# Patient Record
Sex: Male | Born: 1980 | Race: White | Hispanic: No | Marital: Married | Smoking: Current every day smoker
Health system: Southern US, Community
[De-identification: ages and names within clinical notes are randomized; demographics above are authoritative.]

## PROBLEM LIST (undated history)

## (undated) DIAGNOSIS — K8020 Calculus of gallbladder without cholecystitis without obstruction: Secondary | ICD-10-CM

## (undated) HISTORY — PX: MINOR EXCISION EAR CANAL MASS: SHX6241

## (undated) HISTORY — PX: WISDOM TOOTH EXTRACTION: SHX21

---

## 2003-11-02 ENCOUNTER — Emergency Department (HOSPITAL_COMMUNITY): Admission: EM | Admit: 2003-11-02 | Discharge: 2003-11-02 | Payer: Self-pay | Admitting: *Deleted

## 2006-03-11 ENCOUNTER — Emergency Department (HOSPITAL_COMMUNITY): Admission: EM | Admit: 2006-03-11 | Discharge: 2006-03-11 | Payer: Self-pay | Admitting: Emergency Medicine

## 2008-05-13 ENCOUNTER — Ambulatory Visit (HOSPITAL_COMMUNITY): Admission: RE | Admit: 2008-05-13 | Discharge: 2008-05-13 | Payer: Self-pay | Admitting: Family Medicine

## 2009-05-20 ENCOUNTER — Emergency Department (HOSPITAL_COMMUNITY): Admission: EM | Admit: 2009-05-20 | Discharge: 2009-05-20 | Payer: Self-pay | Admitting: Emergency Medicine

## 2015-03-10 ENCOUNTER — Encounter (HOSPITAL_COMMUNITY): Payer: Self-pay | Admitting: Emergency Medicine

## 2015-03-10 ENCOUNTER — Observation Stay (HOSPITAL_COMMUNITY)
Admission: EM | Admit: 2015-03-10 | Discharge: 2015-03-12 | Disposition: A | Discharge status: Home / Self Care | Payer: BLUE CROSS/BLUE SHIELD | Attending: General Surgery | Admitting: General Surgery

## 2015-03-10 ENCOUNTER — Emergency Department (HOSPITAL_COMMUNITY): Payer: BLUE CROSS/BLUE SHIELD

## 2015-03-10 DIAGNOSIS — Z79899 Other long term (current) drug therapy: Secondary | ICD-10-CM | POA: Diagnosis not present

## 2015-03-10 DIAGNOSIS — K8000 Calculus of gallbladder with acute cholecystitis without obstruction: Principal | ICD-10-CM | POA: Insufficient documentation

## 2015-03-10 DIAGNOSIS — K8010 Calculus of gallbladder with chronic cholecystitis without obstruction: Secondary | ICD-10-CM | POA: Diagnosis present

## 2015-03-10 DIAGNOSIS — K810 Acute cholecystitis: Secondary | ICD-10-CM

## 2015-03-10 DIAGNOSIS — F17200 Nicotine dependence, unspecified, uncomplicated: Secondary | ICD-10-CM | POA: Insufficient documentation

## 2015-03-10 HISTORY — DX: Calculus of gallbladder without cholecystitis without obstruction: K80.20

## 2015-03-10 LAB — COMPREHENSIVE METABOLIC PANEL
ALK PHOS: 85 U/L (ref 38–126)
ALT: 42 U/L (ref 17–63)
ANION GAP: 8 (ref 5–15)
AST: 30 U/L (ref 15–41)
Albumin: 4.2 g/dL (ref 3.5–5.0)
BUN: 10 mg/dL (ref 6–20)
CO2: 27 mmol/L (ref 22–32)
Calcium: 9.3 mg/dL (ref 8.9–10.3)
Chloride: 103 mmol/L (ref 101–111)
Creatinine, Ser: 0.97 mg/dL (ref 0.61–1.24)
Glucose, Bld: 113 mg/dL — ABNORMAL HIGH (ref 65–99)
Potassium: 3.7 mmol/L (ref 3.5–5.1)
Sodium: 138 mmol/L (ref 135–145)
TOTAL PROTEIN: 7.6 g/dL (ref 6.5–8.1)
Total Bilirubin: 0.5 mg/dL (ref 0.3–1.2)

## 2015-03-10 LAB — CBC
HEMATOCRIT: 45.9 % (ref 39.0–52.0)
HEMOGLOBIN: 15.7 g/dL (ref 13.0–17.0)
MCH: 29.7 pg (ref 26.0–34.0)
MCHC: 34.2 g/dL (ref 30.0–36.0)
MCV: 86.8 fL (ref 78.0–100.0)
Platelets: 274 10*3/uL (ref 150–400)
RBC: 5.29 MIL/uL (ref 4.22–5.81)
RDW: 12.1 % (ref 11.5–15.5)
WBC: 9.8 10*3/uL (ref 4.0–10.5)

## 2015-03-10 LAB — URINALYSIS, ROUTINE W REFLEX MICROSCOPIC
BILIRUBIN URINE: NEGATIVE
Glucose, UA: NEGATIVE mg/dL
Hgb urine dipstick: NEGATIVE
Ketones, ur: NEGATIVE mg/dL
Leukocytes, UA: NEGATIVE
NITRITE: NEGATIVE
PH: 7.5 (ref 5.0–8.0)
SPECIFIC GRAVITY, URINE: 1.015 (ref 1.005–1.030)

## 2015-03-10 LAB — URINE MICROSCOPIC-ADD ON
Squamous Epithelial / LPF: NONE SEEN
WBC UA: NONE SEEN WBC/hpf (ref 0–5)

## 2015-03-10 LAB — LIPASE, BLOOD: LIPASE: 17 U/L (ref 11–51)

## 2015-03-10 MED ORDER — DIPHENHYDRAMINE HCL 50 MG/ML IJ SOLN: 25.0000 mg | Freq: Four times a day (QID) | INTRAMUSCULAR | Status: DC | PRN

## 2015-03-10 MED ORDER — HYDROMORPHONE HCL 1 MG/ML IJ SOLN
1.0000 mg | INTRAMUSCULAR | Status: DC | PRN
  Administered 2015-03-10 – 2015-03-12 (×4): 1 mg via INTRAVENOUS
  Filled 2015-03-10 (×4): qty 1

## 2015-03-10 MED ORDER — ONDANSETRON 4 MG PO TBDP
4.0000 mg | ORAL_TABLET | Freq: Four times a day (QID) | ORAL | Status: DC | PRN
  Administered 2015-03-12: 4 mg via ORAL
  Filled 2015-03-10: qty 1

## 2015-03-10 MED ORDER — CEFTRIAXONE SODIUM 2 G IJ SOLR
2.0000 g | INTRAMUSCULAR | Status: DC
  Administered 2015-03-10 – 2015-03-12 (×3): 2 g via INTRAVENOUS
  Filled 2015-03-10 (×6): qty 2

## 2015-03-10 MED ORDER — ACETAMINOPHEN 650 MG RE SUPP
650.0000 mg | Freq: Four times a day (QID) | RECTAL | Status: DC | PRN
Start: 2015-03-10 — End: 2015-03-12

## 2015-03-10 MED ORDER — LORAZEPAM 2 MG/ML IJ SOLN: 1.0000 mg | INTRAMUSCULAR | Status: DC | PRN

## 2015-03-10 MED ORDER — SIMETHICONE 80 MG PO CHEW: 40.0000 mg | CHEWABLE_TABLET | Freq: Four times a day (QID) | ORAL | Status: DC | PRN

## 2015-03-10 MED ORDER — LACTATED RINGERS IV SOLN
INTRAVENOUS | Status: DC
  Administered 2015-03-10 – 2015-03-12 (×5): via INTRAVENOUS

## 2015-03-10 MED ORDER — ONDANSETRON HCL 4 MG/2ML IJ SOLN
4.0000 mg | Freq: Once | INTRAMUSCULAR | Status: DC
  Filled 2015-03-10: qty 2

## 2015-03-10 MED ORDER — ONDANSETRON HCL 4 MG/2ML IJ SOLN: 4.0000 mg | Freq: Four times a day (QID) | INTRAMUSCULAR | Status: DC | PRN

## 2015-03-10 MED ORDER — SODIUM CHLORIDE 0.9 % IV BOLUS (SEPSIS)
1000.0000 mL | Freq: Once | INTRAVENOUS | Status: AC
Start: 2015-03-10 — End: 2015-03-10
  Administered 2015-03-10: 1000 mL via INTRAVENOUS

## 2015-03-10 MED ORDER — OXYCODONE-ACETAMINOPHEN 5-325 MG PO TABS
1.0000 | ORAL_TABLET | ORAL | Status: DC | PRN
  Administered 2015-03-12: 1 via ORAL
  Filled 2015-03-10: qty 1

## 2015-03-10 MED ORDER — ONDANSETRON HCL 4 MG/2ML IJ SOLN
4.0000 mg | Freq: Once | INTRAMUSCULAR | Status: AC
  Administered 2015-03-10: 4 mg via INTRAVENOUS

## 2015-03-10 MED ORDER — HYDROMORPHONE HCL 1 MG/ML IJ SOLN
1.0000 mg | Freq: Once | INTRAMUSCULAR | Status: AC
  Administered 2015-03-10: 1 mg via INTRAVENOUS
  Filled 2015-03-10: qty 1

## 2015-03-10 MED ORDER — ENOXAPARIN SODIUM 40 MG/0.4ML ~~LOC~~ SOLN
40.0000 mg | SUBCUTANEOUS | Status: DC
  Administered 2015-03-10 – 2015-03-12 (×3): 40 mg via SUBCUTANEOUS
  Filled 2015-03-10 (×3): qty 0.4

## 2015-03-10 MED ORDER — DIPHENHYDRAMINE HCL 25 MG PO CAPS: 25.0000 mg | ORAL_CAPSULE | Freq: Four times a day (QID) | ORAL | Status: DC | PRN

## 2015-03-10 MED ORDER — MORPHINE SULFATE (PF) 4 MG/ML IV SOLN
4.0000 mg | Freq: Once | INTRAVENOUS | Status: AC
  Administered 2015-03-10: 4 mg via INTRAVENOUS
  Filled 2015-03-10: qty 1

## 2015-03-10 MED ORDER — INFLUENZA VAC SPLIT QUAD 0.5 ML IM SUSY: 0.5000 mL | PREFILLED_SYRINGE | INTRAMUSCULAR | Status: DC | PRN

## 2015-03-10 MED ORDER — ACETAMINOPHEN 325 MG PO TABS: 650.0000 mg | ORAL_TABLET | Freq: Four times a day (QID) | ORAL | Status: DC | PRN

## 2015-03-10 NOTE — H&P (Signed)
Patrick Alvarado is an 35 y.o. male.   Chief Complaint: Right upper quadrant abdominal pain HPI: Patient is a 35 year old white male with a known history of cholelithiasis who presents with an acute episode of right upper quadrant abdominal pain and nausea. He states he has had similar episodes in the past, but they have resolved spontaneously. He presented to the emergency room for further evaluation treatment. Despite multiple doses of IV pain medications, the patient continued to still be hurting. Ultrasound the gallbladder reveals cholelithiasis with a stone lodged in the neck with gallbladder. Common bile duct is within normal limits.  Past Medical History  Diagnosis Date  . Gallstones     History reviewed. No pertinent past surgical history.  History reviewed. No pertinent family history. Social History:  reports that he has been smoking Cigarettes.  He has been smoking about 0.50 packs per day. He does not have any smokeless tobacco history on file. He reports that he drinks alcohol. He reports that he does not use illicit drugs.  Allergies: No Known Allergies  Medications Prior to Admission  Medication Sig Dispense Refill  . calcium carbonate (TUMS - DOSED IN MG ELEMENTAL CALCIUM) 500 MG chewable tablet Chew 1 tablet by mouth daily.    . diphenhydrAMINE (BENADRYL) 25 MG tablet Take 25 mg by mouth every 6 (six) hours as needed for allergies.    Marland Kitchen ibuprofen (ADVIL,MOTRIN) 200 MG tablet Take 400-1,000 mg by mouth every 6 (six) hours as needed.      Results for orders placed or performed during the hospital encounter of 03/10/15 (from the past 48 hour(s))  Lipase, blood     Status: None   Collection Time: 03/10/15  8:43 AM  Result Value Ref Range   Lipase 17 11 - 51 U/L  Comprehensive metabolic panel     Status: Abnormal   Collection Time: 03/10/15  8:43 AM  Result Value Ref Range   Sodium 138 135 - 145 mmol/L   Potassium 3.7 3.5 - 5.1 mmol/L   Chloride 103 101 - 111 mmol/L   CO2 27 22 - 32 mmol/L   Glucose, Bld 113 (H) 65 - 99 mg/dL   BUN 10 6 - 20 mg/dL   Creatinine, Ser 0.97 0.61 - 1.24 mg/dL   Calcium 9.3 8.9 - 10.3 mg/dL   Total Protein 7.6 6.5 - 8.1 g/dL   Albumin 4.2 3.5 - 5.0 g/dL   AST 30 15 - 41 U/L   ALT 42 17 - 63 U/L   Alkaline Phosphatase 85 38 - 126 U/L   Total Bilirubin 0.5 0.3 - 1.2 mg/dL   GFR calc non Af Amer >60 >60 mL/min   GFR calc Af Amer >60 >60 mL/min    Comment: (NOTE) The eGFR has been calculated using the CKD EPI equation. This calculation has not been validated in all clinical situations. eGFR's persistently <60 mL/min signify possible Chronic Kidney Disease.    Anion gap 8 5 - 15  CBC     Status: None   Collection Time: 03/10/15  8:43 AM  Result Value Ref Range   WBC 9.8 4.0 - 10.5 K/uL   RBC 5.29 4.22 - 5.81 MIL/uL   Hemoglobin 15.7 13.0 - 17.0 g/dL   HCT 45.9 39.0 - 52.0 %   MCV 86.8 78.0 - 100.0 fL   MCH 29.7 26.0 - 34.0 pg   MCHC 34.2 30.0 - 36.0 g/dL   RDW 12.1 11.5 - 15.5 %   Platelets 274 150 -  400 K/uL  Urinalysis, Routine w reflex microscopic (not at [facility])     Status: Abnormal   Collection Time: 03/10/15 10:28 AM  Result Value Ref Range   Color, Urine YELLOW YELLOW   APPearance CLOUDY (A) CLEAR   Specific Gravity, Urine 1.015 1.005 - 1.030   pH 7.5 5.0 - 8.0   Glucose, UA NEGATIVE NEGATIVE mg/dL   Hgb urine dipstick NEGATIVE NEGATIVE   Bilirubin Urine NEGATIVE NEGATIVE   Ketones, ur NEGATIVE NEGATIVE mg/dL   Protein, ur TRACE (A) NEGATIVE mg/dL   Nitrite NEGATIVE NEGATIVE   Leukocytes, UA NEGATIVE NEGATIVE  Urine microscopic-add on     Status: Abnormal   Collection Time: 03/10/15 10:28 AM  Result Value Ref Range   Squamous Epithelial / LPF NONE SEEN NONE SEEN   WBC, UA NONE SEEN 0 - 5 WBC/hpf   RBC / HPF 0-5 0 - 5 RBC/hpf   Bacteria, UA MANY (A) NONE SEEN   US Abdomen Complete  03/10/2015  CLINICAL DATA:  Right upper quadrant abdominal pain. History of cholelithiasis. EXAM: ABDOMEN ULTRASOUND  COMPLETE COMPARISON:  05/13/2008 abdominal sonogram. FINDINGS: Gallbladder: There is a nonmobile 2.2 cm shadowing calcified gallstone in the gallbladder neck. Mild gallbladder distention. Mild diffuse gallbladder wall thickening. A sonographic Murphy's sign is present. No pericholecystic fluid. Common bile duct: Diameter: 5 mm Liver: Liver parenchyma is diffusely mildly to moderately echogenic with posterior acoustic attenuation, in keeping with mild-to-moderate diffuse hepatic steatosis. No liver surface irregularity. No liver mass detected, noting decreased sensitivity in the setting of an echogenic liver. IVC: No abnormality visualized. Pancreas:  Limited visualized portion unremarkable. Spleen: Size and appearance within normal limits. Right Kidney: Length: 10.8 cm. Echogenicity within normal limits. No mass or hydronephrosis visualized. Left Kidney: Length: 11.4 cm. Echogenicity within normal limits. No mass or hydronephrosis visualized. Abdominal aorta: No aneurysm visualized. Other findings: None. IMPRESSION: 1. Cholelithiasis, with a 2.2 cm gallstone lodged in the gallbladder neck. Mild gallbladder distention. Mild diffuse gallbladder wall thickening. Sonographic Percell Miller sign is present. Sonographic findings are in keeping with acute calculous cholecystitis. 2. No biliary ductal dilatation. 3. Mild-to-moderate diffuse hepatic steatosis. Electronically Signed   By: Ilona Sorrel M.D.   On: 03/10/2015 10:01    Review of Systems  Constitutional: Negative.   HENT: Negative.   Eyes: Negative.   Cardiovascular: Negative.   Gastrointestinal: Positive for nausea and abdominal pain.  Genitourinary: Negative.   Musculoskeletal: Negative.   Skin: Negative.     Blood pressure 113/77, pulse 86, temperature 97.8 F (36.6 C), temperature source Oral, resp. rate 16, height _0  (1.854 m), weight 102.059 kg (225 lb), SpO2 99 %. Physical Exam  Constitutional: He is oriented to person, place, and time. He  appears well-developed and well-nourished.  HENT:  Head: Normocephalic and atraumatic.  Eyes: No scleral icterus.  Neck: Normal range of motion. Neck supple.  Cardiovascular: Normal rate, regular rhythm and normal heart sounds.   Respiratory: Effort normal and breath sounds normal.  GI: Soft. He exhibits no distension. There is tenderness.  Tender in the right upper quadrant to palpation.  Neurological: He is alert and oriented to person, place, and time.  Skin: Skin is warm and dry.     Assessment/Plan Impression: Cholecystitis, cholelithiasis Plan: Patient will be admitted to the hospital for IV antibiotics and control of his pain and nausea. He will subsequently undergo a laparoscopic cholecystectomy. The risks and benefits of the procedure including bleeding, infection, hepatobiliary injury, and the possibility of an open  procedure were fully explained to the patient, who gave informed consent.  Derral Colucci A 03/10/2015, 12:50 PM

## 2015-03-10 NOTE — ED Notes (Signed)
Pt reports gallbladder pain x3 weeks, inc pain starting last night with diarrhea, nausea and vomiting.  Pt was suggested to have gall bladder taken out 5 years ago.  Pt alert and oriented.

## 2015-03-10 NOTE — ED Provider Notes (Signed)
CSN: [id number]     Arrival date & time 03/10/15  4098 History   First MD Initiated Contact with Patient 03/10/15 [phone]     Chief Complaint  Patient presents with  . Abdominal Pain     (Consider location/radiation/quality/duration/timing/severity/associated sxs/prior Treatment) The history is provided by the patient and the spouse.   Patrick Alvarado is a 35 y.o. male with a 5 year history of known gallstones presenting with a 3 week history of intermittent ruq pain which has been constant for the past week, but tolerable, becoming severe and since 3 am today.  He endorses having persistent nausea and emesis x 1 prior to arrival.  He denies fevers, chills, chest pain or shortness of breath.  The pain radiates into his right flank.  He has been unable to urinate since 3 am, denies hematuria, no history of kidney stones. He has had no medicines prior to arrival today.      Past Medical History  Diagnosis Date  . Gallstones    History reviewed. No pertinent past surgical history. History reviewed. No pertinent family history. Social History  Substance Use Topics  . Smoking status: Current Every Day Smoker -- 0.50 packs/day    Types: Cigarettes  . Smokeless tobacco: None  . Alcohol Use: Yes     Comment: rarely    Review of Systems  Constitutional: Negative for fever.  HENT: Negative for congestion and sore throat.   Eyes: Negative.   Respiratory: Negative for chest tightness and shortness of breath.   Cardiovascular: Negative for chest pain.  Gastrointestinal: Positive for nausea, vomiting and abdominal pain.  Genitourinary: Negative.   Musculoskeletal: Negative for joint swelling, arthralgias and neck pain.  Skin: Negative.  Negative for rash and wound.  Neurological: Negative for dizziness, weakness, light-headedness, numbness and headaches.  Psychiatric/Behavioral: Negative.       Allergies  Review of patient's allergies indicates no known allergies.  Home Medications    Prior to Admission medications   Medication Sig Start Date End Date Taking? Authorizing Provider  calcium carbonate (TUMS - DOSED IN MG ELEMENTAL CALCIUM) 500 MG chewable tablet Chew 1 tablet by mouth daily.   Yes Historical Provider, MD  diphenhydrAMINE (BENADRYL) 25 MG tablet Take 25 mg by mouth every 6 (six) hours as needed for allergies.   Yes Historical Provider, MD  ibuprofen (ADVIL,MOTRIN) 200 MG tablet Take 400-1,000 mg by mouth every 6 (six) hours as needed.   Yes Historical Provider, MD   BP 137/96 mmHg  Pulse 55  Temp(Src) 97.8 F (36.6 C) (Oral)  Resp 16  Ht  (1.854 m)  Wt 102.059 kg  BMI 29.69 kg/m2  SpO2 98% Physical Exam  Constitutional: He appears well-developed and well-nourished.  Appears uncomfortable intermittently.  HENT:  Head: Normocephalic and atraumatic.  Eyes: Conjunctivae are normal.  Neck: Normal range of motion.  Cardiovascular: Normal rate, regular rhythm, normal heart sounds and intact distal pulses.   Pulmonary/Chest: Effort normal and breath sounds normal. He has no wheezes.  Abdominal: Soft. Bowel sounds are decreased. There is tenderness in the right upper quadrant. There is guarding and positive Murphy's sign.  Musculoskeletal: Normal range of motion.  Neurological: He is alert.  Skin: Skin is warm and dry.  Psychiatric: He has a normal mood and affect.  Nursing note and vitals reviewed.   ED Course  Procedures (including critical care time) Labs Review Labs Reviewed  COMPREHENSIVE METABOLIC PANEL - Abnormal; Notable for the following:    Glucose, Bld  113 (*)    All other components within normal limits  LIPASE, BLOOD  CBC  URINALYSIS, ROUTINE W REFLEX MICROSCOPIC (NOT AT [facility])    Imaging Review US Abdomen Complete  03/10/2015  CLINICAL DATA:  Right upper quadrant abdominal pain. History of cholelithiasis. EXAM: ABDOMEN ULTRASOUND COMPLETE COMPARISON:  05/13/2008 abdominal sonogram. FINDINGS: Gallbladder: There is a nonmobile  2.2 cm shadowing calcified gallstone in the gallbladder neck. Mild gallbladder distention. Mild diffuse gallbladder wall thickening. A sonographic Murphy's sign is present. No pericholecystic fluid. Common bile duct: Diameter: 5 mm Liver: Liver parenchyma is diffusely mildly to moderately echogenic with posterior acoustic attenuation, in keeping with mild-to-moderate diffuse hepatic steatosis. No liver surface irregularity. No liver mass detected, noting decreased sensitivity in the setting of an echogenic liver. IVC: No abnormality visualized. Pancreas:  Limited visualized portion unremarkable. Spleen: Size and appearance within normal limits. Right Kidney: Length: 10.8 cm. Echogenicity within normal limits. No mass or hydronephrosis visualized. Left Kidney: Length: 11.4 cm. Echogenicity within normal limits. No mass or hydronephrosis visualized. Abdominal aorta: No aneurysm visualized. Other findings: None. IMPRESSION: 1. Cholelithiasis, with a 2.2 cm gallstone lodged in the gallbladder neck. Mild gallbladder distention. Mild diffuse gallbladder wall thickening. Sonographic Eulah Pont sign is present. Sonographic findings are in keeping with acute calculous cholecystitis. 2. No biliary ductal dilatation. 3. Mild-to-moderate diffuse hepatic steatosis. Electronically Signed   By: Delbert Phenix M.D.   On: 03/10/2015 10:01   I have personally reviewed and evaluated these images and lab results as part of my medical decision-making.   EKG Interpretation None      MDM   Final diagnoses:  Acute cholecystitis    Pt given morphine and zofran IV without improvement in pain, but with no relief of pain.  Given dilaudid 1 mg IV.  Pt with acute cholecystitis per [place], normal labs.  Discussed with Dr. Lovell Sheehan who will admit pt.    Burgess Amor, PA-C 03/10/15 1050  Marily Memos, MD 03/10/15 [phone]

## 2015-03-11 LAB — SURGICAL PCR SCREEN
MRSA, PCR: NEGATIVE
Staphylococcus aureus: NEGATIVE

## 2015-03-11 MED ORDER — CHLORHEXIDINE GLUCONATE 4 % EX LIQD
1.0000 "application " | Freq: Once | CUTANEOUS | Status: AC
  Administered 2015-03-11: 1 via TOPICAL
  Filled 2015-03-11: qty 15

## 2015-03-11 NOTE — Care Management Note (Signed)
Case Management Note  Patient Details  Name: Patrick Alvarado MRN: [id number] Date of Birth: 11-30-1980  Subjective/Objective:                  Pt is from home, lives with spouse and is ind with ADL's. Pt plans to return home with self care after lap chole on 03/12/2015.   Action/Plan: No CM needs anticipated.   Expected Discharge Date:      03/12/2015            Expected Discharge Plan:  Home/Self Care  In-House Referral:  NA  Discharge planning Services  CM Consult  Post Acute Care Choice:  NA Choice offered to:  NA  DME Arranged:    DME Agency:     HH Arranged:    HH Agency:     Status of Service:  Completed, signed off  Medicare Important Message Given:    Date Medicare IM Given:    Medicare IM give by:    Date Additional Medicare IM Given:    Additional Medicare Important Message give by:     If discussed at Long Length of Stay Meetings, dates discussed:    Additional Comments:  Malcolm Metro, RN 03/11/2015, 4:16 PM

## 2015-03-11 NOTE — Progress Notes (Signed)
  Subjective: Patient's abdominal pain has eased.  Objective: Vital signs in last 24 hours: Temp:  [97.7 F (36.5 C)-98.4 F (36.9 C)] 98 F (36.7 C) (01/19 0615) Pulse Rate:  [55-90] 90 (01/19 0615) Resp:  [16-20] 20 (01/19 0615) BP: (110-137)/(70-96) 119/84 mmHg (01/19 0615) SpO2:  [94 %-99 %] 94 % (01/19 0615) Last BM Date: 03/09/15  Intake/Output from previous day: 01/18 0701 - 01/19 0700 In: 2343.3 [P.O.:480; I.V.:1863.3] Out: -  Intake/Output this shift:    General appearance: alert, cooperative and no distress Resp: clear to auscultation bilaterally Cardio: regular rate and rhythm, S1, S2 normal, no murmur, click, rub or gallop GI: Soft, less tender in the right upper quadrant to palpation. No rigidity noted.  Lab Results:   Recent Labs  03/10/15 0843  WBC 9.8  HGB 15.7  HCT 45.9  PLT 274   BMET  Recent Labs  03/10/15 0843  NA 138  K 3.7  CL 103  CO2 27  GLUCOSE 113*  BUN 10  CREATININE 0.97  CALCIUM 9.3   PT/INR No results for input(s): LABPROT, INR in the last 72 hours.  Studies/Results: US Abdomen Complete  03/10/2015  CLINICAL DATA:  Right upper quadrant abdominal pain. History of cholelithiasis. EXAM: ABDOMEN ULTRASOUND COMPLETE COMPARISON:  05/13/2008 abdominal sonogram. FINDINGS: Gallbladder: There is a nonmobile 2.2 cm shadowing calcified gallstone in the gallbladder neck. Mild gallbladder distention. Mild diffuse gallbladder wall thickening. A sonographic Murphy's sign is present. No pericholecystic fluid. Common bile duct: Diameter: 5 mm Liver: Liver parenchyma is diffusely mildly to moderately echogenic with posterior acoustic attenuation, in keeping with mild-to-moderate diffuse hepatic steatosis. No liver surface irregularity. No liver mass detected, noting decreased sensitivity in the setting of an echogenic liver. IVC: No abnormality visualized. Pancreas:  Limited visualized portion unremarkable. Spleen: Size and appearance within normal  limits. Right Kidney: Length: 10.8 cm. Echogenicity within normal limits. No mass or hydronephrosis visualized. Left Kidney: Length: 11.4 cm. Echogenicity within normal limits. No mass or hydronephrosis visualized. Abdominal aorta: No aneurysm visualized. Other findings: None. IMPRESSION: 1. Cholelithiasis, with a 2.2 cm gallstone lodged in the gallbladder neck. Mild gallbladder distention. Mild diffuse gallbladder wall thickening. Sonographic Eulah Pont sign is present. Sonographic findings are in keeping with acute calculous cholecystitis. 2. No biliary ductal dilatation. 3. Mild-to-moderate diffuse hepatic steatosis. Electronically Signed   By: Delbert Phenix M.D.   On: 03/10/2015 10:01    Anti-infectives: Anti-infectives    Start     Dose/Rate Route Frequency Ordered Stop   03/10/15 1200  cefTRIAXone (ROCEPHIN) 2 g in dextrose 5 % 50 mL IVPB     2 g 100 mL/hr over 30 Minutes Intravenous Every 24 hours 03/10/15 1134        Assessment/Plan: s/p Procedure(s): LAPAROSCOPIC CHOLECYSTECTOMY Impression: Acute cholecystitis, cholelithiasis. Pain and nausea have eased. We'll proceed with laparoscopic cholecystectomy tomorrow. The risks and benefits of the procedure including bleeding, infection, hepatobiliary injury, the possibility of an open procedure were fully explained to the patient, who gave informed consent.     Patrick Alvarado A 03/11/2015

## 2015-03-12 ENCOUNTER — Encounter (HOSPITAL_COMMUNITY)
Admission: EM | Disposition: A | Discharge status: Home / Self Care | Payer: Self-pay | Source: Home / Self Care | Attending: Emergency Medicine

## 2015-03-12 ENCOUNTER — Encounter (HOSPITAL_COMMUNITY): Payer: Self-pay | Admitting: *Deleted

## 2015-03-12 ENCOUNTER — Observation Stay (HOSPITAL_COMMUNITY): Payer: BLUE CROSS/BLUE SHIELD | Admitting: Anesthesiology

## 2015-03-12 HISTORY — PX: CHOLECYSTECTOMY: SHX55

## 2015-03-12 SURGERY — LAPAROSCOPIC CHOLECYSTECTOMY
Anesthesia: General

## 2015-03-12 MED ORDER — FENTANYL CITRATE (PF) 100 MCG/2ML IJ SOLN
25.0000 ug | INTRAMUSCULAR | Status: DC | PRN
  Administered 2015-03-12: 50 ug via INTRAVENOUS

## 2015-03-12 MED ORDER — ARTIFICIAL TEARS OP OINT
TOPICAL_OINTMENT | OPHTHALMIC | Status: DC | PRN
  Administered 2015-03-12: 1 via OPHTHALMIC

## 2015-03-12 MED ORDER — ROCURONIUM BROMIDE 50 MG/5ML IV SOLN
INTRAVENOUS | Status: AC
  Filled 2015-03-12: qty 1

## 2015-03-12 MED ORDER — LACTATED RINGERS IV SOLN
INTRAVENOUS | Status: DC
  Administered 2015-03-12: 1000 mL via INTRAVENOUS

## 2015-03-12 MED ORDER — FENTANYL CITRATE (PF) 100 MCG/2ML IJ SOLN
INTRAMUSCULAR | Status: AC
  Filled 2015-03-12: qty 2

## 2015-03-12 MED ORDER — POVIDONE-IODINE 10 % EX OINT
TOPICAL_OINTMENT | CUTANEOUS | Status: AC
  Filled 2015-03-12: qty 1

## 2015-03-12 MED ORDER — FENTANYL CITRATE (PF) 250 MCG/5ML IJ SOLN
INTRAMUSCULAR | Status: AC
  Filled 2015-03-12: qty 5

## 2015-03-12 MED ORDER — NEOSTIGMINE METHYLSULFATE 10 MG/10ML IV SOLN
INTRAVENOUS | Status: AC
  Filled 2015-03-12: qty 1

## 2015-03-12 MED ORDER — FENTANYL CITRATE (PF) 100 MCG/2ML IJ SOLN
INTRAMUSCULAR | Status: DC | PRN
  Administered 2015-03-12 (×8): 50 ug via INTRAVENOUS

## 2015-03-12 MED ORDER — LIDOCAINE HCL (PF) 1 % IJ SOLN
INTRAMUSCULAR | Status: AC
  Filled 2015-03-12: qty 5

## 2015-03-12 MED ORDER — ONDANSETRON HCL 4 MG/2ML IJ SOLN
INTRAMUSCULAR | Status: DC | PRN
  Administered 2015-03-12: 4 mg via INTRAVENOUS

## 2015-03-12 MED ORDER — PROPOFOL 10 MG/ML IV BOLUS
INTRAVENOUS | Status: AC
  Filled 2015-03-12: qty 40

## 2015-03-12 MED ORDER — GLYCOPYRROLATE 0.2 MG/ML IJ SOLN
INTRAMUSCULAR | Status: DC | PRN
  Administered 2015-03-12: 0.6 mg via INTRAVENOUS

## 2015-03-12 MED ORDER — ONDANSETRON HCL 4 MG/2ML IJ SOLN: 4.0000 mg | Freq: Once | INTRAMUSCULAR | Status: DC | PRN

## 2015-03-12 MED ORDER — FENTANYL CITRATE (PF) 100 MCG/2ML IJ SOLN: 25.0000 ug | INTRAMUSCULAR | Status: DC | PRN

## 2015-03-12 MED ORDER — MIDAZOLAM HCL 2 MG/2ML IJ SOLN: 1.0000 mg | INTRAMUSCULAR | Status: DC | PRN

## 2015-03-12 MED ORDER — LIDOCAINE HCL (CARDIAC) 20 MG/ML IV SOLN
INTRAVENOUS | Status: DC | PRN
  Administered 2015-03-12: 50 mg via INTRAVENOUS
  Administered 2015-03-12: 150 mg via INTRAVENOUS

## 2015-03-12 MED ORDER — KETOROLAC TROMETHAMINE 30 MG/ML IJ SOLN
30.0000 mg | Freq: Once | INTRAMUSCULAR | Status: AC
  Administered 2015-03-12: 30 mg via INTRAVENOUS

## 2015-03-12 MED ORDER — LACTATED RINGERS IV SOLN: INTRAVENOUS | Status: DC

## 2015-03-12 MED ORDER — SODIUM CHLORIDE 0.9 % IR SOLN
Status: DC | PRN
  Administered 2015-03-12: 1000 mL
  Administered 2015-03-12: 3000 mL

## 2015-03-12 MED ORDER — GLYCOPYRROLATE 0.2 MG/ML IJ SOLN
INTRAMUSCULAR | Status: AC
  Filled 2015-03-12: qty 3

## 2015-03-12 MED ORDER — HEMOSTATIC AGENTS (NO CHARGE) OPTIME
TOPICAL | Status: DC | PRN
  Administered 2015-03-12: 2 via TOPICAL

## 2015-03-12 MED ORDER — NEOSTIGMINE METHYLSULFATE 10 MG/10ML IV SOLN
INTRAVENOUS | Status: DC | PRN
  Administered 2015-03-12: 4 mg via INTRAVENOUS

## 2015-03-12 MED ORDER — POVIDONE-IODINE 10 % OINT PACKET
TOPICAL_OINTMENT | CUTANEOUS | Status: DC | PRN
  Administered 2015-03-12: 1 via TOPICAL

## 2015-03-12 MED ORDER — BUPIVACAINE HCL (PF) 0.5 % IJ SOLN
INTRAMUSCULAR | Status: DC | PRN
  Administered 2015-03-12: 10 mL

## 2015-03-12 MED ORDER — BUPIVACAINE HCL (PF) 0.5 % IJ SOLN
INTRAMUSCULAR | Status: AC
  Filled 2015-03-12: qty 30

## 2015-03-12 MED ORDER — MIDAZOLAM HCL 2 MG/2ML IJ SOLN
1.0000 mg | INTRAMUSCULAR | Status: DC | PRN
  Administered 2015-03-12: 2 mg via INTRAVENOUS

## 2015-03-12 MED ORDER — KETOROLAC TROMETHAMINE 30 MG/ML IJ SOLN
INTRAMUSCULAR | Status: AC
  Filled 2015-03-12: qty 1

## 2015-03-12 MED ORDER — SODIUM CHLORIDE 0.9 % IJ SOLN
INTRAMUSCULAR | Status: AC
  Filled 2015-03-12: qty 10

## 2015-03-12 MED ORDER — MIDAZOLAM HCL 2 MG/2ML IJ SOLN
INTRAMUSCULAR | Status: AC
  Filled 2015-03-12: qty 2

## 2015-03-12 MED ORDER — ONDANSETRON HCL 4 MG/2ML IJ SOLN
INTRAMUSCULAR | Status: AC
  Filled 2015-03-12: qty 2

## 2015-03-12 MED ORDER — OXYCODONE-ACETAMINOPHEN 7.5-325 MG PO TABS: 1.0000 | ORAL_TABLET | ORAL | Status: DC | PRN

## 2015-03-12 MED ORDER — ROCURONIUM BROMIDE 100 MG/10ML IV SOLN
INTRAVENOUS | Status: DC | PRN
  Administered 2015-03-12 (×2): 5 mg via INTRAVENOUS
  Administered 2015-03-12: 10 mg via INTRAVENOUS
  Administered 2015-03-12: 30 mg via INTRAVENOUS
  Administered 2015-03-12: 10 mg via INTRAVENOUS

## 2015-03-12 SURGICAL SUPPLY — 44 items
APPLIER CLIP LAPSCP 10X32 DD (CLIP) ×2 IMPLANT
BAG HAMPER (MISCELLANEOUS) ×2 IMPLANT
CHLORAPREP W/TINT 26ML (MISCELLANEOUS) ×2 IMPLANT
CLOTH BEACON ORANGE TIMEOUT ST (SAFETY) ×2 IMPLANT
COVER LIGHT HANDLE STERIS (MISCELLANEOUS) ×4 IMPLANT
CUTTER FLEX LINEAR 45M (STAPLE) ×2 IMPLANT
DECANTER SPIKE VIAL GLASS SM (MISCELLANEOUS) ×2 IMPLANT
ELECT REM PT RETURN 9FT ADLT (ELECTROSURGICAL) ×2
ELECTRODE REM PT RTRN 9FT ADLT (ELECTROSURGICAL) ×1 IMPLANT
FILTER SMOKE EVAC LAPAROSHD (FILTER) ×2 IMPLANT
FORMALIN 10 PREFIL 120ML (MISCELLANEOUS) ×2 IMPLANT
GLOVE BIOGEL PI IND STRL 7.0 (GLOVE) ×3 IMPLANT
GLOVE BIOGEL PI INDICATOR 7.0 (GLOVE) ×3
GLOVE ECLIPSE 6.5 STRL STRAW (GLOVE) ×4 IMPLANT
GLOVE EXAM NITRILE PF LG BLUE (GLOVE) ×2 IMPLANT
GLOVE SURG SS PI 7.5 STRL IVOR (GLOVE) ×2 IMPLANT
GOWN STRL REUS W/ TWL XL LVL3 (GOWN DISPOSABLE) ×1 IMPLANT
GOWN STRL REUS W/TWL LRG LVL3 (GOWN DISPOSABLE) ×4 IMPLANT
GOWN STRL REUS W/TWL XL LVL3 (GOWN DISPOSABLE) ×1
HEMOSTAT SNOW SURGICEL 2X4 (HEMOSTASIS) ×4 IMPLANT
INST SET LAPROSCOPIC AP (KITS) ×2 IMPLANT
IV NS IRRIG 3000ML ARTHROMATIC (IV SOLUTION) ×2 IMPLANT
KIT ROOM TURNOVER APOR (KITS) ×2 IMPLANT
MANIFOLD NEPTUNE II (INSTRUMENTS) ×2 IMPLANT
NEEDLE INSUFFLATION 14GA 120MM (NEEDLE) ×2 IMPLANT
NS IRRIG 1000ML POUR BTL (IV SOLUTION) ×2 IMPLANT
PACK LAP CHOLE LZT030E (CUSTOM PROCEDURE TRAY) ×2 IMPLANT
PAD ARMBOARD 7.5X6 YLW CONV (MISCELLANEOUS) ×2 IMPLANT
PENCIL HANDSWITCHING (ELECTRODE) ×2 IMPLANT
POUCH SPECIMEN RETRIEVAL 10MM (ENDOMECHANICALS) ×2 IMPLANT
RELOAD STAPLE TA45 3.5 REG BLU (ENDOMECHANICALS) ×2 IMPLANT
SET BASIN LINEN APH (SET/KITS/TRAYS/PACK) ×2 IMPLANT
SET TUBE IRRIG SUCTION NO TIP (IRRIGATION / IRRIGATOR) ×2 IMPLANT
SLEEVE ENDOPATH XCEL 5M (ENDOMECHANICALS) ×2 IMPLANT
SPONGE GAUZE 2X2 8PLY STRL LF (GAUZE/BANDAGES/DRESSINGS) ×8 IMPLANT
STAPLER VISISTAT (STAPLE) ×2 IMPLANT
SUT VICRYL 0 UR6 27IN ABS (SUTURE) ×4 IMPLANT
TAPE CLOTH SURG 4X10 WHT LF (GAUZE/BANDAGES/DRESSINGS) ×2 IMPLANT
TROCAR ENDO BLADELESS 11MM (ENDOMECHANICALS) ×2 IMPLANT
TROCAR XCEL NON-BLD 5MMX100MML (ENDOMECHANICALS) ×2 IMPLANT
TROCAR XCEL UNIV SLVE 11M 100M (ENDOMECHANICALS) ×2 IMPLANT
TUBING INSUFFLATION (TUBING) ×2 IMPLANT
WARMER LAPAROSCOPE (MISCELLANEOUS) ×2 IMPLANT
YANKAUER SUCT 12FT TUBE ARGYLE (SUCTIONS) ×2 IMPLANT

## 2015-03-12 NOTE — Transfer of Care (Signed)
Immediate Anesthesia Transfer of Care Note  Patient: Patrick Alvarado  Procedure(s) Performed: Procedure(s): LAPAROSCOPIC CHOLECYSTECTOMY (N/A)  Patient Location: PACU  Anesthesia Type:General  Level of Consciousness: awake, alert , oriented and patient cooperative  Airway & Oxygen Therapy: Patient Spontanous Breathing and Patient connected to face mask oxygen  Post-op Assessment: Report given to RN and Post -op Vital signs reviewed and stable  Post vital signs: Reviewed and stable  Last Vitals:  Filed Vitals:   03/12/15 0755 03/12/15 0946  BP: 114/77 140/94  Pulse:  66  Temp:  36.9 C  Resp: 19 18    Complications: No apparent anesthesia complications

## 2015-03-12 NOTE — Anesthesia Preprocedure Evaluation (Addendum)
Anesthesia Evaluation  Patient identified by MRN, date of birth, ID band Patient awake    Reviewed: Allergy & Precautions, NPO status , Patient's Chart, lab work & pertinent test results  Airway Mallampati: III  TM Distance: <3 FB Neck ROM: Full    Dental  (+) Teeth Intact, Missing,    Pulmonary Current Smoker,    Pulmonary exam normal        Cardiovascular Normal cardiovascular exam     Neuro/Psych    GI/Hepatic   Endo/Other    Renal/GU      Musculoskeletal   Abdominal Normal abdominal exam  (+)   Peds  Hematology   Anesthesia Other Findings   Reproductive/Obstetrics                           Anesthesia Physical Anesthesia Plan  ASA: II  Anesthesia Plan: General   Post-op Pain Management:    Induction: Intravenous  Airway Management Planned: Oral ETT  Additional Equipment:   Intra-op Plan:   Post-operative Plan: Extubation in OR  Informed Consent: I have reviewed the patients History and Physical, chart, labs and discussed the procedure including the risks, benefits and alternatives for the proposed anesthesia with the patient or authorized representative who has indicated his/her understanding and acceptance.   Dental advisory given  Plan Discussed with: CRNA  Anesthesia Plan Comments:         Anesthesia Quick Evaluation

## 2015-03-12 NOTE — Anesthesia Procedure Notes (Signed)
Procedure Name: Intubation Date/Time: 03/12/2015 8:09 AM Performed by: Pernell Dupre, AMY A Pre-anesthesia Checklist: Patient identified, Patient being monitored, Timeout performed, Emergency Drugs available and Suction available Patient Re-evaluated:Patient Re-evaluated prior to inductionOxygen Delivery Method: Circle System Utilized Preoxygenation: Pre-oxygenation with 100% oxygen Intubation Type: IV induction Ventilation: Mask ventilation without difficulty Laryngoscope Size: 3 and Miller Grade View: Grade I Tube type: Oral Tube size: 7.0 mm Number of attempts: 1 Airway Equipment and Method: Stylet Placement Confirmation: ETT inserted through vocal cords under direct vision,  positive ETCO2 and breath sounds checked- equal and bilateral Secured at: 21 cm Tube secured with: Tape Dental Injury: Teeth and Oropharynx as per pre-operative assessment

## 2015-03-12 NOTE — Discharge Instructions (Signed)
Laparoscopic Cholecystectomy, Care After °Refer to this sheet in the next few weeks. These instructions provide you with information about caring for yourself after your procedure. Your health care provider may also give you more specific instructions. Your treatment has been planned according to current medical practices, but problems sometimes occur. Call your health care provider if you have any problems or questions after your procedure. °WHAT TO EXPECT AFTER THE PROCEDURE °After your procedure, it is common to have: °· Pain at your incision sites. You will be given pain medicines to control your pain. °· Mild nausea or vomiting. This should improve after the first 24 hours. °· Bloating and possible shoulder pain from the gas that was used during the procedure. This will improve after the first 24 hours. °HOME CARE INSTRUCTIONS °Incision Care °· Follow instructions from your health care provider about how to take care of your incisions. Make sure you: °¨ Wash your hands with soap and water before you change your bandage (dressing). If soap and water are not available, use hand sanitizer. °¨ Change your dressing as told by your health care provider. °¨ Leave stitches (sutures), skin glue, or adhesive strips in place. These skin closures may need to be in place for 2 weeks or longer. If adhesive strip edges start to loosen and curl up, you may trim the loose edges. Do not remove adhesive strips completely unless your health care provider tells you to do that. °· Do not take baths, swim, or use a hot tub until your health care provider approves. Ask your health care provider if you can take showers. You may only be allowed to take sponge baths for bathing. °General Instructions °· Take over-the-counter and prescription medicines only as told by your health care provider. °· Do not drive or operate heavy machinery while taking prescription pain medicine. °· Return to your normal diet as told by your health care  provider. °· Do not lift anything that is heavier than 10 lb (4.5 kg). °· Do not play contact sports for one week or until your health care provider approves. °SEEK MEDICAL CARE IF:  °· You have redness, swelling, or pain at the site of your incision. °· You have fluid, blood, or pus coming from your incision. °· You notice a bad smell coming from your incision area. °· Your surgical incisions break open. °· You have a fever. °SEEK IMMEDIATE MEDICAL CARE IF: °· You develop a rash. °· You have difficulty breathing. °· You have chest pain. °· You have increasing pain in your shoulders (shoulder strap areas). °· You faint or have dizzy episodes while you are standing. °· You have severe pain in your abdomen. °· You have nausea or vomiting that lasts for more than one day. °  °This information is not intended to replace advice given to you by your health care provider. Make sure you discuss any questions you have with your health care provider. °  °Document Released: 02/06/2005 Document Revised: 10/28/2014 Document Reviewed: 09/18/2012 °Elsevier Interactive Patient Education ©2016 [employer] ° °

## 2015-03-12 NOTE — Anesthesia Postprocedure Evaluation (Signed)
Anesthesia Post Note Late Entry  Patient: Jason Coop  Procedure(s) Performed: Procedure(s) (LRB): LAPAROSCOPIC CHOLECYSTECTOMY (N/A)  Patient location during evaluation: PACU Level of consciousness: awake and alert and oriented Pain management: pain level controlled Vital Signs Assessment: post-procedure vital signs reviewed and stable Respiratory status: spontaneous breathing and respiratory function stable Cardiovascular status: stable Postop Assessment: no signs of nausea or vomiting Anesthetic complications: no (Nausea and vomiting resolved)    Last Vitals:  Filed Vitals:   03/12/15 0946 03/12/15 1000  BP: 140/94 139/100  Pulse: 66 64  Temp: 36.9 C   Resp: 18 14    Last Pain:  Filed Vitals:   03/12/15 1008  PainSc: 0-No pain                 ADAMS, AMY A

## 2015-03-12 NOTE — Progress Notes (Signed)
Discharge instructions given, verbalized understanding, out in stable condition via w/c with staff. 

## 2015-03-12 NOTE — Op Note (Signed)
Patient:  Patrick Alvarado  DOB:  Jul 01, 1980  MRN:  [id number]   Preop Diagnosis:  Acute cholecystitis, cholelithiasis  Postop Diagnosis:  Same  Procedure:  Laparoscopic cholecystectomy  Surgeon:  Franky Macho, M.D.  Anes:  Gen. endotracheal  Indications:  Patient is a 35 year old white male who presents with acute cholecystitis secondary to cholelithiasis. The risks and benefits of the procedure including bleeding, infection, hepatobiliary injury, and the possibility of an open procedure were fully explained to the patient, who gave informed consent.  Procedure note:  The patient was placed the supine position. After induction of general endotracheal anesthesia, the abdomen was prepped and draped using the usual sterile technique with DuraPrep. Surgical site confirmation was performed.  A supraumbilical incision was made down to the fascia. A Veress needle was introduced into the abdominal cavity and confirmation of placement was done using the saline drop test. The abdomen was then insufflated to 16 mmHg pressure. An 11 mm trocar was introduced into the abdominal cavity under direct visualization without difficulty. The patient was placed in reverse Trendelenburg position and additional 11 mm trocar was placed the epigastric region and 5 mm trochars were placed the right upper quadrant and right flank regions. The liver was inspected and noted to be within normal limits. Hydrops of the gallbladder was noted with edematous and thickened gallbladder wall. The gallbladder was decompressed by aspiration in order to facilitate exposure and dissection. The gallbladder was retracted in a dynamic fashion in order to provide a critical view of the triangle of Calot. The cystic duct was first identified. Its juncture to the infundibulum was fully identified. A 45 Endo GIA was placed proximally and distally on the cystic duct, and the cystic duct was divided. The cystic artery was ligated and divided using  endoclips. The gallbladder was freed away from the gallbladder fossa using Bovie cautery. The gallbladder was delivered through the epigastric trocar site using an Endo Catch bag. The gallbladder fossa was inspected and no abnormal bleeding or bile leakage was noted. The area was irrigated normal saline. Surgicel was placed in the gallbladder fossa. All fluid and air were then evacuated from the abdominal cavity prior to removal of the trochars.  All wounds were irrigated with normal saline. All wounds were injected with 0.5% Sensorcaine. The supraumbilical fascia as well as epigastric fascia reapproximated using 0 Vicryl interrupted sutures. All skin incisions were closed using staples. Betadine ointment and dry sterile dressings were applied.  All tape and needle counts were correct at the end of the procedure. The patient was extubated in the operating room and transferred to PACU in stable condition.  Complications:  None  EBL:  50 mL  Specimen:  Gallbladder

## 2015-03-14 NOTE — Discharge Summary (Signed)
Physician Discharge Summary  Patient ID: Patrick Alvarado MRN: [id number] DOB/AGE: 1980-08-14 34 y.o.  Admit date: 03/10/2015 Discharge date: 03/12/2015 Admission Diagnoses: Acute cholecystitis, cholelithiasis  Discharge Diagnoses: Same Active Problems:   Cholecystitis with cholelithiasis   Discharged Condition: good  Hospital Course: Patient is a 35 year old white male with a known history of cholelithiasis who presented emergency room on 03/10/2015 with worsening right upper quadrant abdominal pain. Ultrasound revealed acute cholecystitis.  His liver enzyme tests were within normal limits. He was admitted to the hospital for control of his pain and nausea. He subsequently underwent a laparoscopic cholecystectomy on 03/12/2015. Tolerated the procedure well. His postoperative course was unremarkable. Diet was advanced without difficulty. He was discharged home on 03/12/2015 in good and improving condition.  Treatments: surgery: Laparoscopic cholecystectomy on 03/12/2015  Discharge Exam: Blood pressure 117/82, pulse 64, temperature 98.6 F (37 C), temperature source Oral, resp. rate 16, height  (1.854 m), weight 102.059 kg (225 lb), SpO2 96 %. General appearance: alert, cooperative and no distress Resp: clear to auscultation bilaterally Cardio: regular rate and rhythm, S1, S2 normal, no murmur, click, rub or gallop GI: Soft, incisions healing well.  Disposition: 01-Home or Self Care     Medication List    TAKE these medications        calcium carbonate 500 MG chewable tablet  Commonly known as:  TUMS - dosed in mg elemental calcium  Chew 1 tablet by mouth daily.     diphenhydrAMINE 25 MG tablet  Commonly known as:  BENADRYL  Take 25 mg by mouth every 6 (six) hours as needed for allergies.     ibuprofen 200 MG tablet  Commonly known as:  ADVIL,MOTRIN  Take 400-1,000 mg by mouth every 6 (six) hours as needed.     oxyCODONE-acetaminophen 7.5-325 MG tablet  Commonly  known as:  PERCOCET  Take 1-2 tablets by mouth every 4 (four) hours as needed.           Follow-up Information    Follow up with Dalia Heading, MD On 03/23/2015.   Specialty:  General Surgery   Why:  Call office for appointment time   Contact information:   [id number] [place] [address] [phone]       Signed: Franky Macho A 03/14/2015, 11:04 AM

## 2015-03-15 ENCOUNTER — Encounter (HOSPITAL_COMMUNITY): Payer: Self-pay | Admitting: General Surgery

## 2017-06-08 IMAGING — US US ABDOMEN COMPLETE
1 series · 13 of 25 positions shown · non-contrast
Comparison: 05/13/2008 abdominal sonogram.

CLINICAL DATA: Right upper quadrant abdominal pain. History of
cholelithiasis.

EXAM:
ABDOMEN ULTRASOUND COMPLETE

[Series 1: us abdomen complete · 0.25mm/px · 13 of 85 slices shown]
[im 1/85]
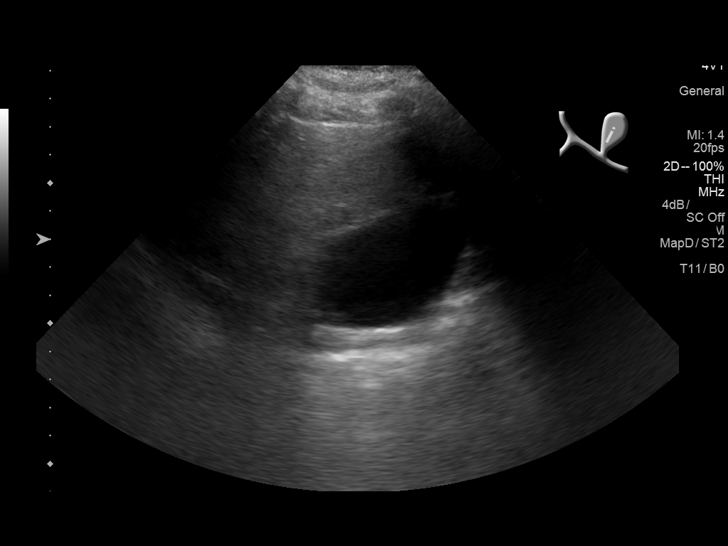
[im 8/85]
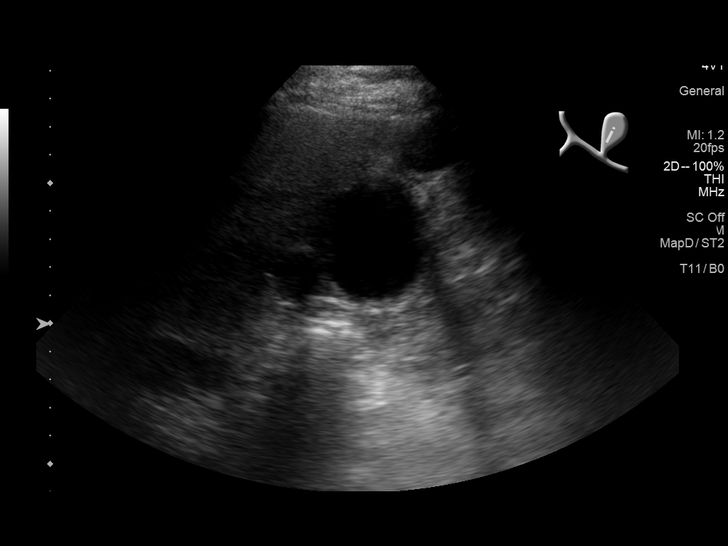
[im 15/85]
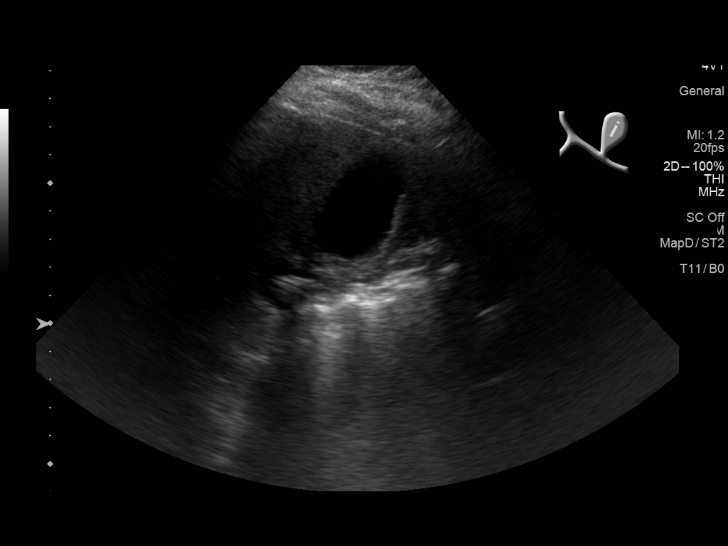
[im 22/85]
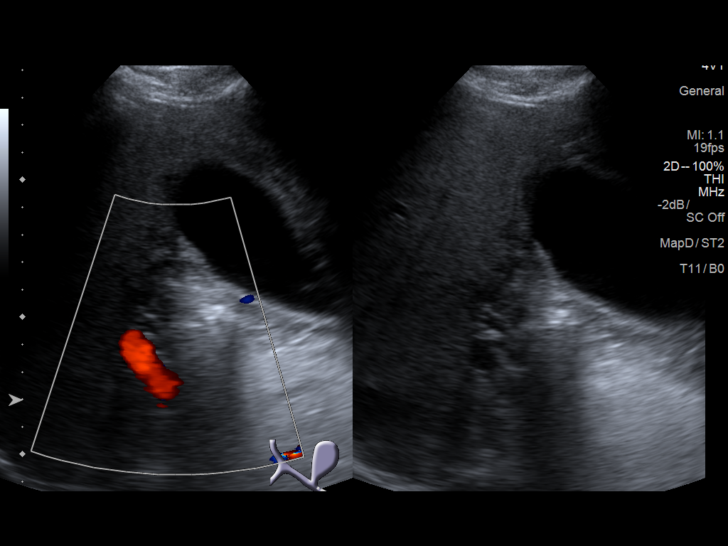
[im 29/85]
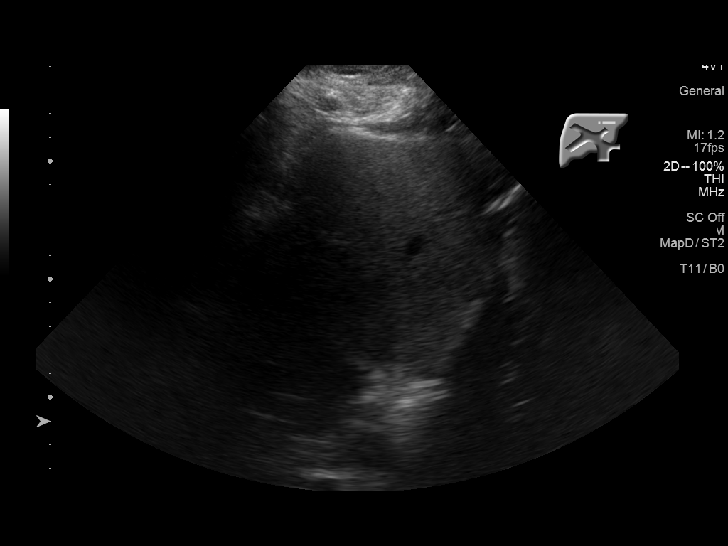
[im 36/85]
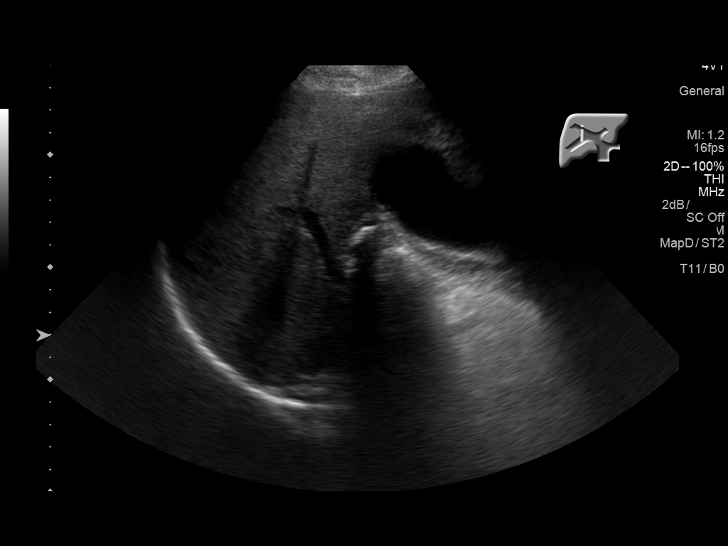
[im 43/85]
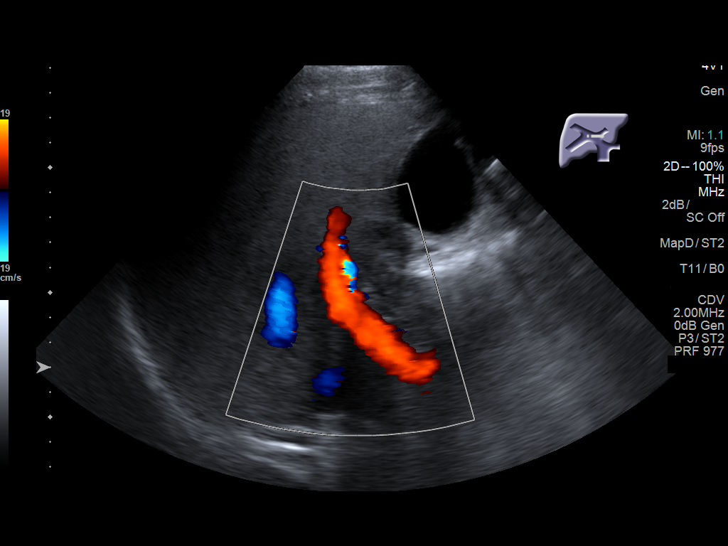
[im 50/85]
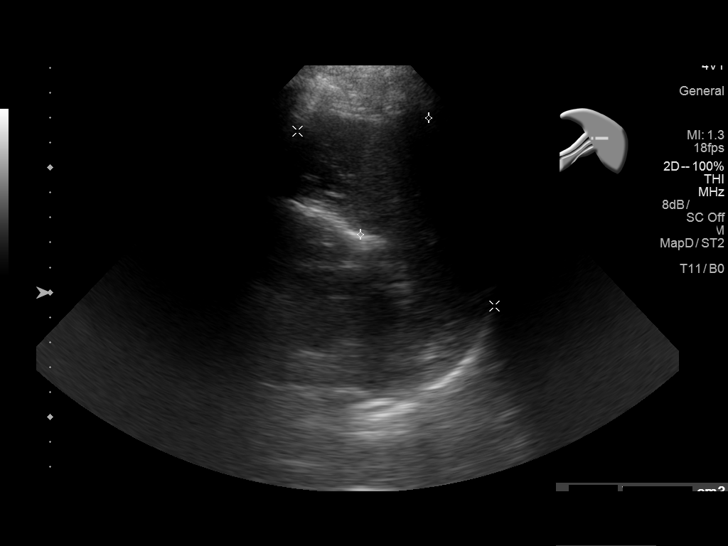
[im 57/85]
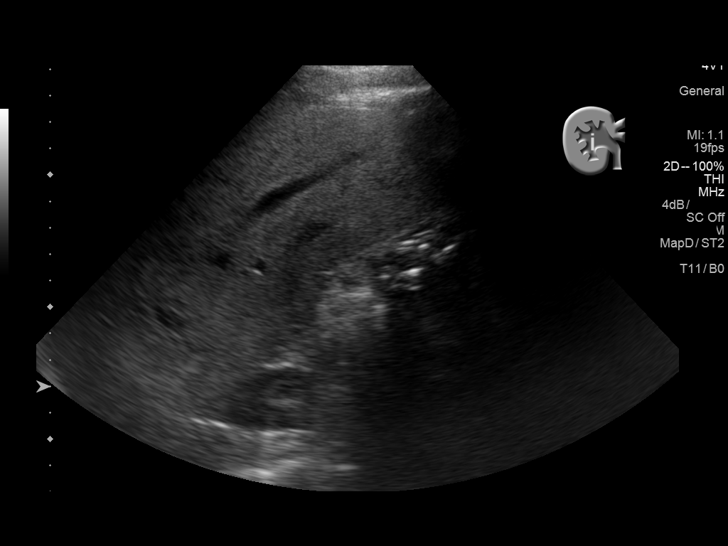
[im 64/85]
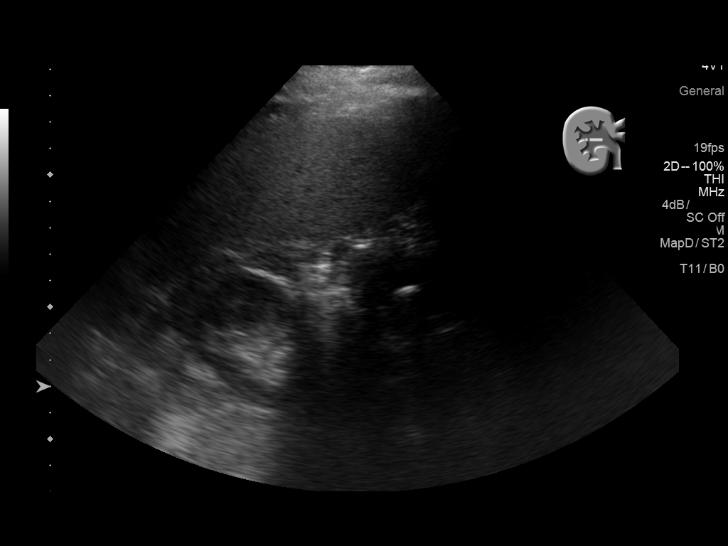
[im 71/85]
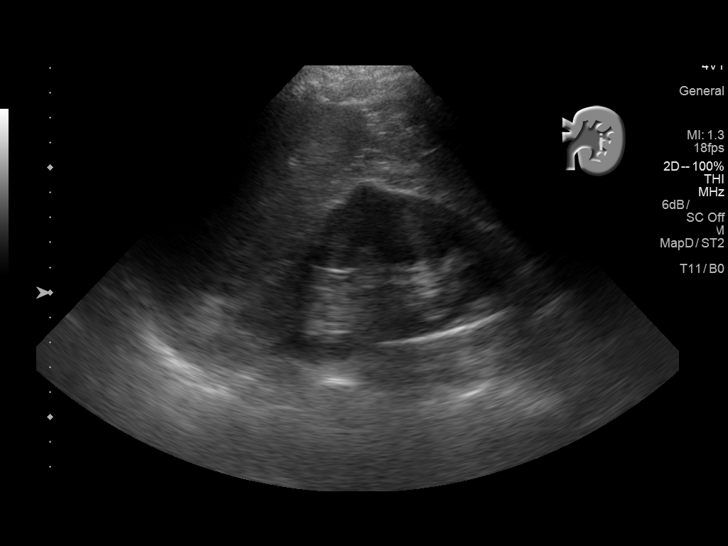
[im 78/85]
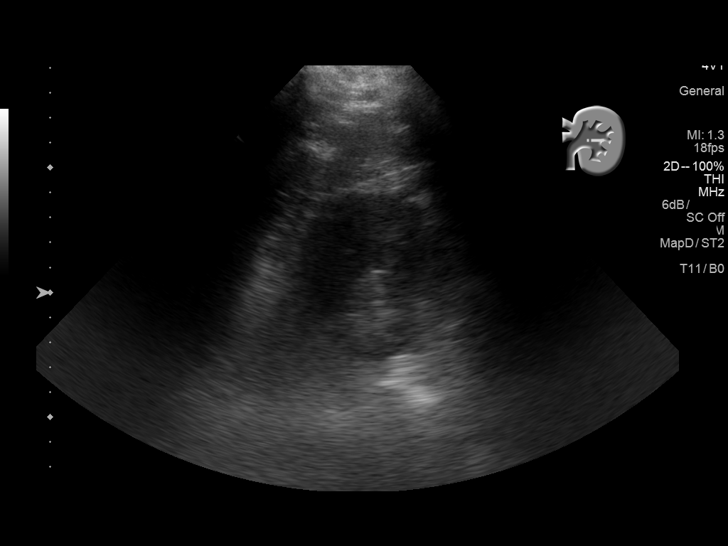
[im 85/85]
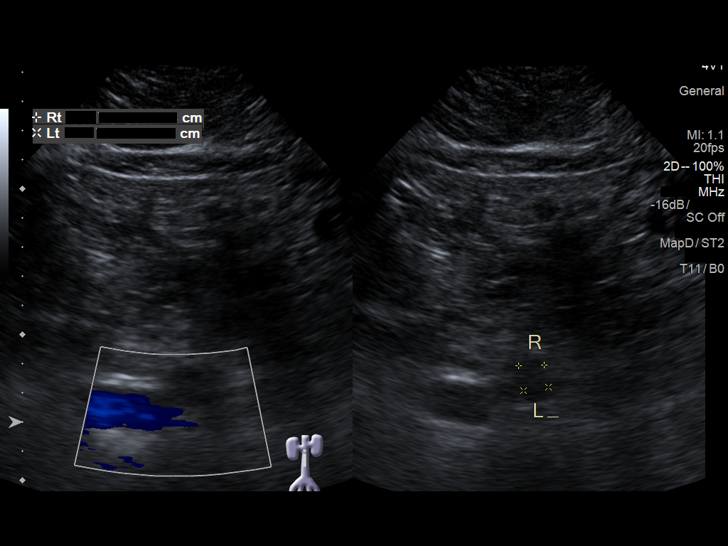

[13 of 25 positions shown; findings below may reference images not displayed]

FINDINGS: Gallbladder: There is a nonmobile 2.2 cm shadowing calcified
gallstone in the gallbladder neck. Mild gallbladder distention. Mild
diffuse gallbladder wall thickening. A sonographic Murphy's sign is
present. No pericholecystic fluid.

Common bile duct: Diameter: 5 mm

Liver: Liver parenchyma is diffusely mildly to moderately echogenic
with posterior acoustic attenuation, in keeping with
mild-to-moderate diffuse hepatic steatosis. No liver surface
irregularity. No liver mass detected, noting decreased sensitivity
in the setting of an echogenic liver.

IVC: No abnormality visualized.

Pancreas:  Limited visualized portion unremarkable.

Spleen: Size and appearance within normal limits.

Right Kidney: Length: 10.8 cm. Echogenicity within normal limits. No
mass or hydronephrosis visualized.

Left Kidney: Length: 11.4 cm. Echogenicity within normal limits. No
mass or hydronephrosis visualized.

Abdominal aorta: No aneurysm visualized.

Other findings: None.
IMPRESSION: 1. Cholelithiasis, with a 2.2 cm gallstone lodged in the gallbladder
neck. Mild gallbladder distention. Mild diffuse gallbladder wall
thickening. Sonographic Murphy sign is present. Sonographic findings
are in keeping with acute calculous cholecystitis.
2. No biliary ductal dilatation.
3. Mild-to-moderate diffuse hepatic steatosis.

## 2018-11-28 ENCOUNTER — Other Ambulatory Visit: Payer: Self-pay

## 2018-11-28 DIAGNOSIS — Z20822 Contact with and (suspected) exposure to covid-19: Secondary | ICD-10-CM

## 2018-11-30 LAB — NOVEL CORONAVIRUS, NAA: SARS-CoV-2, NAA: DETECTED — AB

## 2020-08-31 ENCOUNTER — Other Ambulatory Visit: Payer: Self-pay

## 2020-08-31 ENCOUNTER — Ambulatory Visit
Admission: EM | Admit: 2020-08-31 | Discharge: 2020-08-31 | Disposition: A | Discharge status: Home / Self Care | Payer: BC Managed Care – PPO | Attending: Family Medicine | Admitting: Family Medicine

## 2020-08-31 DIAGNOSIS — B029 Zoster without complications: Secondary | ICD-10-CM

## 2020-08-31 MED ORDER — TRIAMCINOLONE ACETONIDE 0.1 % EX CREA: 1.0000 "application " | TOPICAL_CREAM | Freq: Two times a day (BID) | CUTANEOUS | 0 refills | Status: DC

## 2020-08-31 MED ORDER — PREDNISONE 20 MG PO TABS: 40.0000 mg | ORAL_TABLET | Freq: Every day | ORAL | 0 refills | Status: AC

## 2020-08-31 MED ORDER — VALACYCLOVIR HCL 1 G PO TABS: 1000.0000 mg | ORAL_TABLET | Freq: Three times a day (TID) | ORAL | 0 refills | Status: AC

## 2020-08-31 NOTE — ED Triage Notes (Signed)
Rash that is painful on RT side of torso since Friday

## 2020-09-03 NOTE — ED Provider Notes (Signed)
[facility]    CSN: [id number] Arrival date & time: 08/31/20  1450      History   Chief Complaint Chief Complaint  Patient presents with   Rash    HPI Patrick Alvarado is a 40 y.o. male.   HPI Patient presents with rash to lower right flank wrapping to mid lower pain. Characterizes rash as painful and burning. No history of shingles. History of childhood chicken pox. Has applied over the counter cortisone cream without improvement of rash.  Past Medical History:  Diagnosis Date   Gallstones     Patient Active Problem List   Diagnosis Date Noted   Cholecystitis with cholelithiasis 03/10/2015    Past Surgical History:  Procedure Laterality Date   CHOLECYSTECTOMY N/A 03/12/2015   Procedure: LAPAROSCOPIC CHOLECYSTECTOMY;  Surgeon: Franky Macho, MD;  Location: [facility];  Service: General;  Laterality: N/A;   MINOR EXCISION EAR CANAL MASS Right    x 2   WISDOM TOOTH EXTRACTION         Home Medications    Prior to Admission medications   Medication Sig Start Date End Date Taking? Authorizing Provider  predniSONE (DELTASONE) 20 MG tablet Take 2 tablets (40 mg total) by mouth daily with breakfast for 5 days. 08/31/20 09/05/20 Yes Bing Neighbors, FNP  triamcinolone cream (KENALOG) 0.1 % Apply 1 application topically 2 (two) times daily. 08/31/20  Yes Bing Neighbors, FNP  valACYclovir (VALTREX) 1000 MG tablet Take 1 tablet (1,000 mg total) by mouth 3 (three) times daily for 10 days. 08/31/20 09/10/20 Yes Bing Neighbors, FNP  calcium carbonate (TUMS - DOSED IN MG ELEMENTAL CALCIUM) 500 MG chewable tablet Chew 1 tablet by mouth daily.    [provider]  diphenhydrAMINE (BENADRYL) 25 MG tablet Take 25 mg by mouth every 6 (six) hours as needed for allergies.    [provider]  ibuprofen (ADVIL,MOTRIN) 200 MG tablet Take 400-1,000 mg by mouth every 6 (six) hours as needed.    [provider]  oxyCODONE-acetaminophen (PERCOCET) 7.5-325  MG tablet Take 1-2 tablets by mouth every 4 (four) hours as needed. 03/12/15   Franky Macho, MD    Family History No family history on file.  Social History Social History   Tobacco Use   Smoking status: Every Day    Packs/day: 0.50    Types: Cigarettes  Substance Use Topics   Alcohol use: Yes    Comment: rarely   Drug use: No     Allergies   Patient has no known allergies.   Review of Systems Review of Systems Pertinent negatives listed in HPI   Physical Exam Triage Vital Signs ED Triage Vitals [08/31/20 1616]  Enc Vitals Group     BP (!) 144/95     Pulse Rate 97     Resp 17     Temp 99.1 F (37.3 C)     Temp Source Oral     SpO2 98 %     Weight      Height      Head Circumference      Peak Flow      Pain Score      Pain Loc      Pain Edu?      Excl. in GC?    No data found.  Updated Vital Signs BP (!) 144/95 (BP Location: Right Arm)   Pulse 97   Temp 99.1 F (37.3 C) (Oral)   Resp 17   SpO2  98%   Visual Acuity Right Eye Distance:   Left Eye Distance:   Bilateral Distance:    Right Eye Near:   Left Eye Near:    Bilateral Near:     Physical Exam Constitutional:      Appearance: Normal appearance.  HENT:     Head: Normocephalic.  Cardiovascular:     Rate and Rhythm: Normal rate.     Pulses: Normal pulses.  Skin:         Comments: Vesicular zoster form rash with erythema   Neurological:     Mental Status: He is alert.     UC Treatments / Results  Labs (all labs ordered are listed, but only abnormal results are displayed) Labs Reviewed - No data to display  EKG   Radiology No results found.  Procedures Procedures (including critical care time)  Medications Ordered in UC Medications - No data to display  Initial Impression / Assessment and Plan / UC Course  I have reviewed the triage vital signs and the nursing notes.  Pertinent labs & imaging results that were available during my care of the patient were reviewed by  me and considered in my medical decision making (see chart for details).     Shingles rash. Treatment per discharge. Return precautions if symptoms worsen or do not improvement. Patient verbalized understanding and agreement as plan. Final Clinical Impressions(s) / UC Diagnoses   Final diagnoses:  Herpes zoster without complication   Discharge Instructions   None    ED Prescriptions     Medication Sig Dispense Auth. Provider   valACYclovir (VALTREX) 1000 MG tablet Take 1 tablet (1,000 mg total) by mouth 3 (three) times daily for 10 days. 30 tablet Bing Neighbors, FNP   predniSONE (DELTASONE) 20 MG tablet Take 2 tablets (40 mg total) by mouth daily with breakfast for 5 days. 10 tablet Bing Neighbors, FNP   triamcinolone cream (KENALOG) 0.1 % Apply 1 application topically 2 (two) times daily. 454 g Bing Neighbors, FNP      PDMP not reviewed this encounter.   Bing Neighbors, FNP 09/05/20 2030

## 2022-12-15 DIAGNOSIS — E669 Obesity, unspecified: Secondary | ICD-10-CM | POA: Diagnosis not present

## 2022-12-15 DIAGNOSIS — Z7182 Exercise counseling: Secondary | ICD-10-CM | POA: Diagnosis not present

## 2022-12-15 DIAGNOSIS — Z7689 Persons encountering health services in other specified circumstances: Secondary | ICD-10-CM | POA: Diagnosis not present

## 2022-12-15 DIAGNOSIS — Z713 Dietary counseling and surveillance: Secondary | ICD-10-CM | POA: Diagnosis not present

## 2023-06-28 DIAGNOSIS — R109 Unspecified abdominal pain: Secondary | ICD-10-CM | POA: Diagnosis not present

## 2023-07-03 NOTE — Progress Notes (Unsigned)
 Referring Provider: Ayesha Lente, FNP Primary Care Physician:  Ayesha Lente, FNP Primary GI Physician: Dr. Mordechai April  Chief Complaint  Patient presents with   Abdominal Pain    Abdominal pain on right side. Vomiting a couple of times     HPI:   Patrick Alvarado is a 43 y.o. male presenting today at the request of  Eliott Guess T, FNP for abdominal pain.   Reviewed referral information.  OV with  Eliott Guess T, FNP 06/28/23 the patient complaining of abdominal pain and bloating.  Stated he woke up Monday with stomach pain, swelling, 7-8 out of 10 abdominal pain, and vomiting.  On Tuesday, had abdominal pain at 5 out of 10 severity without vomiting.  On 5/7, pain continued to improve and he was able to have a light dinner.  And at the time of his office visit, pain down to 2 out of 10.  Patient reported taking lansoprazole, Mylanta, Alka-Seltzer chews OTC to help with abdominal pain.  Labs completed same day of office visit with CBC, CMP within normal limits.  He was referred to GI for further evaluation.  Today: RUQ abdominal pain, no appetite, nausea, and vomiting a couple of time starting 5/5. Each day, pain has slowly gone down gradually. Not really having pain right now. Had slight discomfort yesterday.   Has had similar symptoms in the past with his gallbladder, but this was removed in 2017.   No GERD, chronic nausea, vomiting, dysphagia.   Started prevacid Monday on 5/5.    Sunday night had homemade pizza. Had a little extra cheese and was a little greasy.  Looked distended across upper abdomen.   Has 3 Bms per day at baseline. Always loose. More so since cholecystectomy. Occasional blood in the toilet water. No melena. States the intermittent rectal bleeding has been going on a while. States he was checked and was told he had an internal hemorrhoid. Occurs about once a month or less.   Took dual action advil for his abdominal pain, but prior to this, only on occasion.     Past Medical History:  Diagnosis Date   Gallstones     Past Surgical History:  Procedure Laterality Date   CHOLECYSTECTOMY N/A 03/12/2015   Procedure: LAPAROSCOPIC CHOLECYSTECTOMY;  Surgeon: Alanda Allegra, MD;  Location: [facility];  Service: General;  Laterality: N/A;   MINOR EXCISION EAR CANAL MASS Right    x 2   WISDOM TOOTH EXTRACTION      Current Outpatient Medications  Medication Sig Dispense Refill   calcium carbonate (TUMS - DOSED IN MG ELEMENTAL CALCIUM) 500 MG chewable tablet Chew 1 tablet by mouth daily.     lansoprazole (PREVACID SOLUTAB) 15 MG disintegrating tablet Take 15 mg by mouth daily at 12 noon.     No current facility-administered medications for this visit.    Allergies as of 07/05/2023   (No Known Allergies)    Family History  Problem Relation Age of Onset   Colon polyps Father    Colon cancer Neg Hx     Social History   Socioeconomic History   Marital status: Married    Spouse name: Not on file   Number of children: Not on file   Years of education: Not on file   Highest education level: Not on file  Occupational History   Not on file  Tobacco Use   Smoking status: Every Day    Types: E-cigarettes   Smokeless tobacco: Not on  file  Substance and Sexual Activity   Alcohol use: Yes    Comment: rarely   Drug use: No   Sexual activity: Not on file  Other Topics Concern   Not on file  Social History Narrative   Not on file   Social Drivers of Health   Financial Resource Strain: Not on file  Food Insecurity: Not on file  Transportation Needs: Not on file  Physical Activity: Not on file  Stress: Not on file  Social Connections: Not on file    Review of Systems: Gen: Denies fever, chills, cold-like symptoms, presyncope, syncope. CV: Denies chest pain, palpitations. Resp: Denies dyspnea, cough. GI: See HPI Heme: See HPI  Physical Exam: BP 136/88 (BP Location: Right Arm, Patient Position: Sitting, Cuff Size: Large)   Pulse 82    Temp 98.1 F (36.7 C) (Temporal)   Ht 6\' 1"  (1.854 m)   Wt 228 lb 9.6 oz (103.7 kg)   BMI 30.16 kg/m  General:   Alert and oriented. No distress noted. Pleasant and cooperative.  Head:  Normocephalic and atraumatic. Eyes:  Conjuctiva clear without scleral icterus. Heart:  S1, S2 present without murmurs appreciated. Lungs:  Clear to auscultation bilaterally. No wheezes, rales, or rhonchi. No distress.  Abdomen:  +BS, soft, and non-distended.  Mild TTP in RUQ region, minimal TTP in epigastric region.  No rebound or guarding. No HSM or masses noted. Msk:  Symmetrical without gross deformities. Normal posture. Extremities:  Without edema. Neurologic:  Alert and  oriented x4 Psych:  Normal mood and affect.    Assessment:  43 year old male with history of cholecystectomy in 2017, presenting today for further evaluation of RUQ abdominal pain at the request of Eliott Guess, FNP.   RUQ abdominal pain: Acute onset on 06/25/2023 with associated upper abdominal distention and vomiting x 2.  Denies any chronic heartburn or other upper GI symptoms, but empirically started Prevacid over-the-counter daily on 5/5 and states his abdominal pain has been slowly improving day by day with very minimal discomfort today.  No recurrent vomiting.  No change in bowel habits, BRBPR, melena, regular NSAID use.  Labs completed 06/28/2023 with CBC, CMP within normal limits.  Etiology is not clear.  Considering normal LFTs, less likely biliary etiology, but considering pain was primarily RUQ region and he still has tenderness in this area on exam today, will arrange RUQ ultrasound.  If unrevealing, recommend proceeding with EGD for further evaluation and starting pantoprazole 40 mg daily.   Rectal bleeding: Chronic, intermittent rectal bleeding.  States he was previously checked and told that he had a hemorrhoid.  Symptoms occur once a month or less.  Discussed possibility of a colonoscopy, but patient declines.   Plan:   RUQ ultrasound. Ok to complete 14 day course of lansoprazole for now (ending in 3 days) If RUQ ultrasound is unrevealing, recommend proceeding with EGD and starting pantoprazole 40 mg daily.   Shana Daring, PA-C [facility] 07/05/2023

## 2023-07-05 ENCOUNTER — Ambulatory Visit: Admitting: Gastroenterology

## 2023-07-05 ENCOUNTER — Encounter: Payer: Self-pay | Admitting: Gastroenterology

## 2023-07-05 VITALS — BP 136/88 | HR 82 | Temp 98.1°F | Ht 73.0 in | Wt 228.6 lb

## 2023-07-05 DIAGNOSIS — R1011 Right upper quadrant pain: Secondary | ICD-10-CM | POA: Diagnosis not present

## 2023-07-05 DIAGNOSIS — K625 Hemorrhage of anus and rectum: Secondary | ICD-10-CM | POA: Diagnosis not present

## 2023-07-05 NOTE — Patient Instructions (Signed)
 We will get you scheduled for an ultrasound of your right upper abdomen at [facility].   Further recommendations to follow.   Shana Daring, PA-C [facility]

## 2023-07-06 ENCOUNTER — Ambulatory Visit (HOSPITAL_COMMUNITY)
Admission: RE | Admit: 2023-07-06 | Discharge: 2023-07-06 | Disposition: A | Discharge status: Home / Self Care | Source: Ambulatory Visit | Attending: Gastroenterology | Admitting: Gastroenterology

## 2023-07-06 DIAGNOSIS — R1011 Right upper quadrant pain: Secondary | ICD-10-CM | POA: Diagnosis not present

## 2023-07-06 DIAGNOSIS — Z9049 Acquired absence of other specified parts of digestive tract: Secondary | ICD-10-CM | POA: Diagnosis not present

## 2023-07-06 DIAGNOSIS — K760 Fatty (change of) liver, not elsewhere classified: Secondary | ICD-10-CM | POA: Diagnosis not present

## 2023-07-08 ENCOUNTER — Ambulatory Visit: Payer: Self-pay | Admitting: Gastroenterology

## 2023-07-09 ENCOUNTER — Other Ambulatory Visit: Payer: Self-pay | Admitting: *Deleted

## 2023-07-09 DIAGNOSIS — K9186 Retained cholelithiasis following cholecystectomy: Secondary | ICD-10-CM

## 2023-07-09 DIAGNOSIS — R1011 Right upper quadrant pain: Secondary | ICD-10-CM

## 2023-07-24 ENCOUNTER — Ambulatory Visit: Payer: Self-pay | Admitting: Gastroenterology

## 2023-07-24 ENCOUNTER — Ambulatory Visit
Admission: RE | Admit: 2023-07-24 | Discharge: 2023-07-24 | Disposition: A | Discharge status: Home / Self Care | Source: Ambulatory Visit | Attending: Gastroenterology

## 2023-07-24 DIAGNOSIS — R1011 Right upper quadrant pain: Secondary | ICD-10-CM

## 2023-07-24 DIAGNOSIS — K8020 Calculus of gallbladder without cholecystitis without obstruction: Secondary | ICD-10-CM | POA: Diagnosis not present

## 2023-07-24 DIAGNOSIS — K760 Fatty (change of) liver, not elsewhere classified: Secondary | ICD-10-CM | POA: Diagnosis not present

## 2023-07-24 DIAGNOSIS — K9186 Retained cholelithiasis following cholecystectomy: Secondary | ICD-10-CM

## 2023-07-24 MED ORDER — GADOPICLENOL 0.5 MMOL/ML IV SOLN
10.0000 mL | Freq: Once | INTRAVENOUS | Status: AC | PRN
  Administered 2023-07-24: 10 mL via INTRAVENOUS
